# Patient Record
Sex: Female | Born: 1960 | Race: White | Hispanic: No | Marital: Married | Smoking: Current every day smoker
Health system: Southern US, Community
[De-identification: ages and names within clinical notes are randomized; demographics above are authoritative.]

## PROBLEM LIST (undated history)

## (undated) DIAGNOSIS — I10 Essential (primary) hypertension: Secondary | ICD-10-CM

## (undated) HISTORY — PX: ABDOMINAL HYSTERECTOMY: SHX81

---

## 2005-01-26 ENCOUNTER — Ambulatory Visit: Payer: Self-pay | Admitting: Obstetrics and Gynecology

## 2006-03-01 ENCOUNTER — Ambulatory Visit: Payer: Self-pay | Admitting: Obstetrics and Gynecology

## 2007-03-07 ENCOUNTER — Ambulatory Visit: Payer: Self-pay | Admitting: Obstetrics and Gynecology

## 2008-03-14 ENCOUNTER — Ambulatory Visit: Payer: Self-pay | Admitting: Obstetrics and Gynecology

## 2009-02-09 ENCOUNTER — Ambulatory Visit: Payer: Self-pay | Admitting: Gastroenterology

## 2009-03-16 ENCOUNTER — Ambulatory Visit: Payer: Self-pay | Admitting: Obstetrics and Gynecology

## 2010-03-17 ENCOUNTER — Ambulatory Visit: Payer: Self-pay | Admitting: Obstetrics and Gynecology

## 2011-03-21 ENCOUNTER — Ambulatory Visit: Payer: Self-pay | Admitting: Obstetrics and Gynecology

## 2012-03-21 ENCOUNTER — Ambulatory Visit: Payer: Self-pay | Admitting: Obstetrics and Gynecology

## 2013-03-15 ENCOUNTER — Ambulatory Visit: Payer: Self-pay | Admitting: Gastroenterology

## 2013-03-26 ENCOUNTER — Ambulatory Visit: Payer: Self-pay | Admitting: Obstetrics and Gynecology

## 2014-03-05 ENCOUNTER — Ambulatory Visit: Payer: Self-pay | Admitting: Podiatry

## 2014-03-27 ENCOUNTER — Ambulatory Visit: Payer: Self-pay | Admitting: Obstetrics and Gynecology

## 2014-04-24 ENCOUNTER — Ambulatory Visit: Payer: Self-pay | Admitting: Podiatry

## 2014-07-15 IMAGING — MG MM DIGITAL SCREENING BILAT W/ CAD
1 series · 4 of 4 positions shown · non-contrast
Comparison: Previous exam(s).

CLINICAL DATA: Screening.

EXAM:
DIGITAL SCREENING BILATERAL MAMMOGRAM WITH CAD

[R CC · right · 4 of 4 slices shown]
[im 1/4]
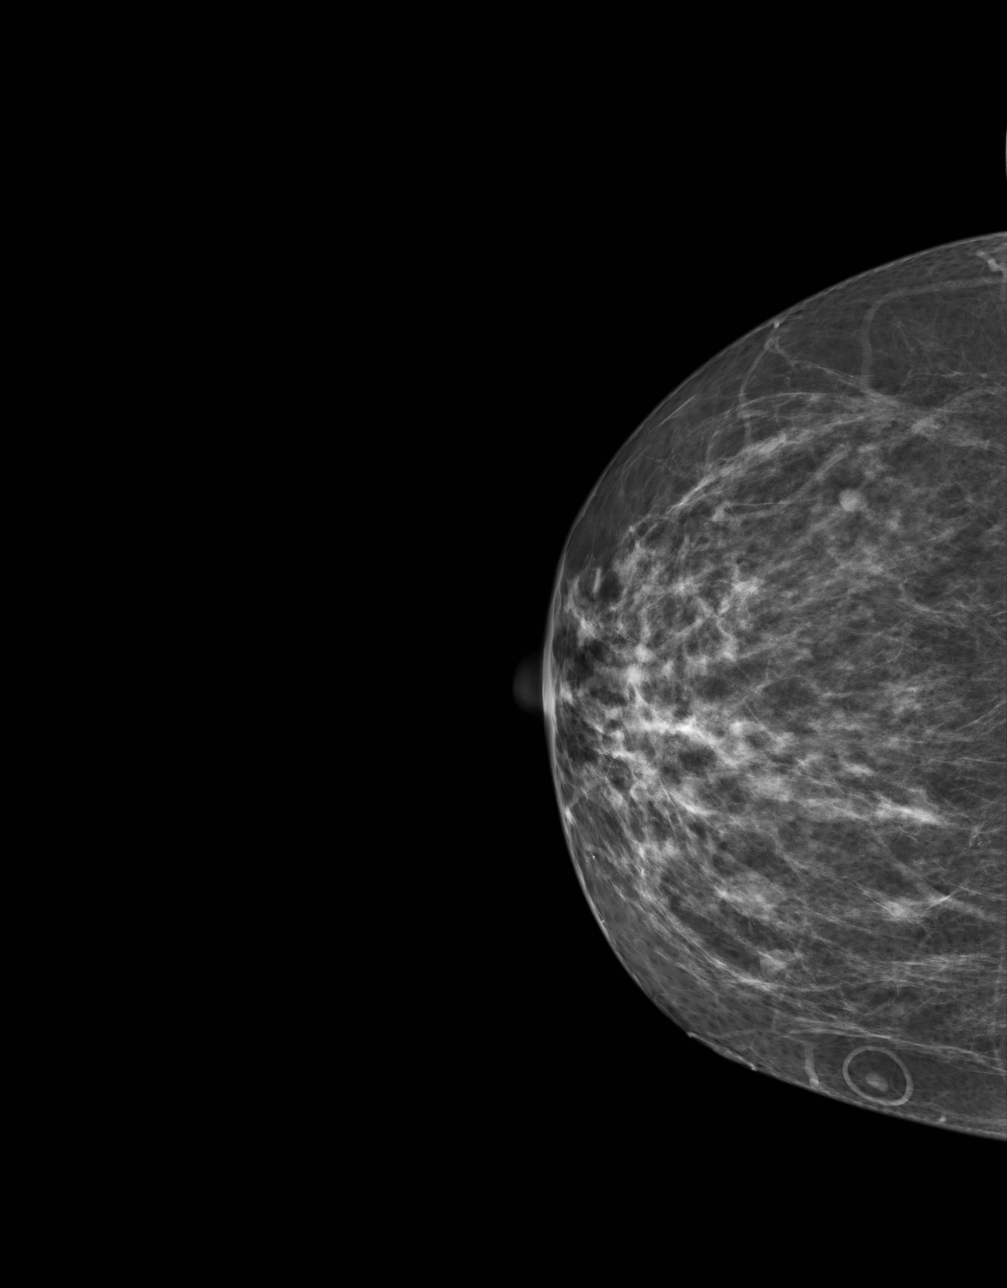
[im 2/4]
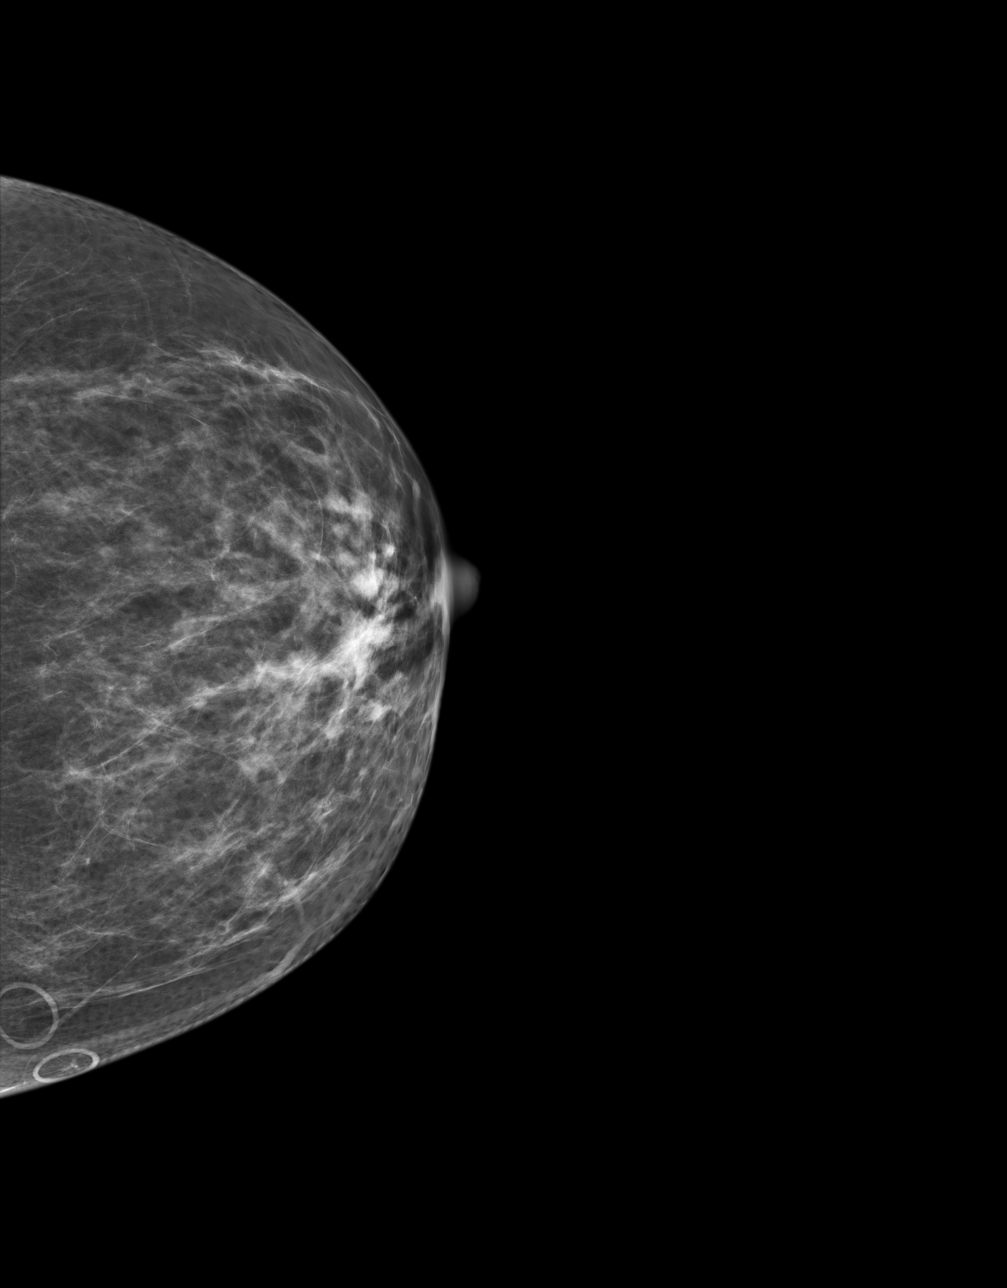
[im 3/4]
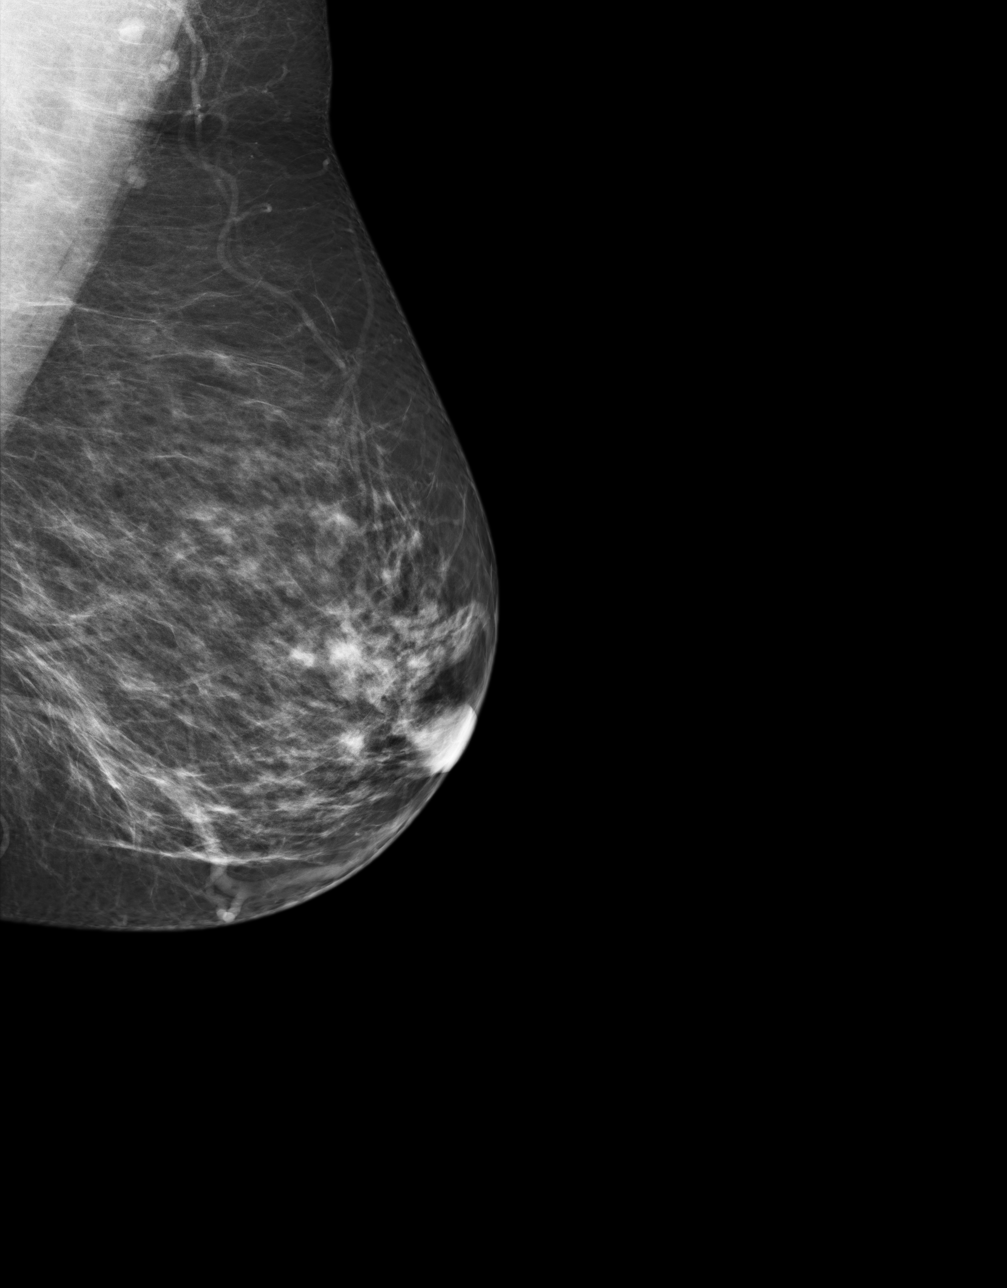
[im 4/4]
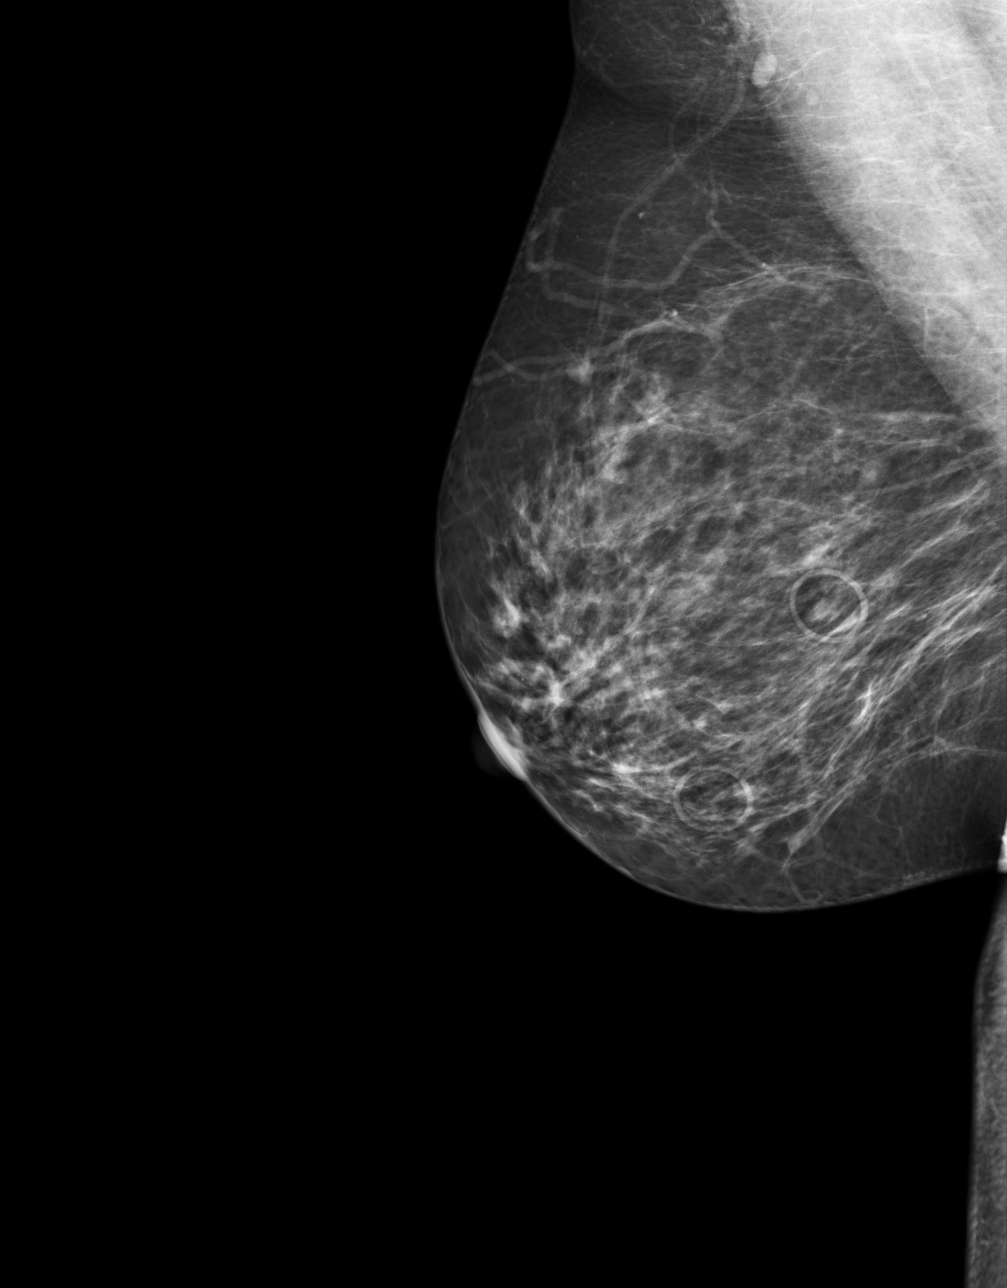

[4 of 4 positions shown; findings below may reference images not displayed]

ACR Breast Density Category b: There are scattered areas of
fibroglandular density.
FINDINGS: There are no findings suspicious for malignancy. Images were
processed with CAD.
IMPRESSION: No mammographic evidence of malignancy. A result letter of this
screening mammogram will be mailed directly to the patient.

RECOMMENDATION:
Screening mammogram in one year. (Code:GW-[id number])

BI-RADS CATEGORY  1: Negative

## 2014-10-21 ENCOUNTER — Ambulatory Visit: Payer: Self-pay

## 2015-01-06 ENCOUNTER — Other Ambulatory Visit: Payer: Self-pay | Admitting: Obstetrics and Gynecology

## 2015-01-06 DIAGNOSIS — Z1231 Encounter for screening mammogram for malignant neoplasm of breast: Secondary | ICD-10-CM

## 2015-03-21 ENCOUNTER — Emergency Department
Admission: EM | Admit: 2015-03-21 | Discharge: 2015-03-21 | Disposition: A | Discharge status: Home / Self Care | Payer: BC Managed Care – PPO | Attending: Emergency Medicine | Admitting: Emergency Medicine

## 2015-03-21 ENCOUNTER — Encounter: Payer: Self-pay | Admitting: Emergency Medicine

## 2015-03-21 ENCOUNTER — Emergency Department: Payer: BC Managed Care – PPO

## 2015-03-21 DIAGNOSIS — W010XXA Fall on same level from slipping, tripping and stumbling without subsequent striking against object, initial encounter: Secondary | ICD-10-CM | POA: Insufficient documentation

## 2015-03-21 DIAGNOSIS — Y998 Other external cause status: Secondary | ICD-10-CM | POA: Insufficient documentation

## 2015-03-21 DIAGNOSIS — Y92014 Private driveway to single-family (private) house as the place of occurrence of the external cause: Secondary | ICD-10-CM | POA: Insufficient documentation

## 2015-03-21 DIAGNOSIS — Z720 Tobacco use: Secondary | ICD-10-CM | POA: Insufficient documentation

## 2015-03-21 DIAGNOSIS — Y9389 Activity, other specified: Secondary | ICD-10-CM | POA: Diagnosis not present

## 2015-03-21 DIAGNOSIS — S8002XA Contusion of left knee, initial encounter: Secondary | ICD-10-CM

## 2015-03-21 DIAGNOSIS — I10 Essential (primary) hypertension: Secondary | ICD-10-CM | POA: Diagnosis not present

## 2015-03-21 DIAGNOSIS — S8992XA Unspecified injury of left lower leg, initial encounter: Secondary | ICD-10-CM | POA: Diagnosis present

## 2015-03-21 HISTORY — DX: Essential (primary) hypertension: I10

## 2015-03-21 MED ORDER — OXYCODONE-ACETAMINOPHEN 5-325 MG PO TABS: 1.0000 | ORAL_TABLET | Freq: Four times a day (QID) | ORAL | Status: AC | PRN

## 2015-03-21 MED ORDER — KETOROLAC TROMETHAMINE 60 MG/2ML IM SOLN
60.0000 mg | Freq: Once | INTRAMUSCULAR | Status: AC
  Administered 2015-03-21: 60 mg via INTRAMUSCULAR
  Filled 2015-03-21: qty 2

## 2015-03-21 MED ORDER — IBUPROFEN 800 MG PO TABS: 800.0000 mg | ORAL_TABLET | Freq: Three times a day (TID) | ORAL | Status: DC | PRN

## 2015-03-21 MED ORDER — HYDROMORPHONE HCL 1 MG/ML IJ SOLN
1.0000 mg | Freq: Once | INTRAMUSCULAR | Status: AC
  Administered 2015-03-21: 1 mg via INTRAMUSCULAR
  Filled 2015-03-21: qty 1

## 2015-03-21 NOTE — ED Provider Notes (Signed)
[facility] Emergency Department Provider Note  ____________________________________________  Time seen: Approximately 6:47 PM  I have reviewed the triage vital signs and the nursing notes.   HISTORY  Chief Complaint Knee Pain    HPI Tamara Maxwell is a 54 y.o. female complaining of left knee pain secondary to a fall. Patient states she's tripped and fell off is of her driveway. Patient states she landed on her left knee since the incident unable to weight-bear. Patient rating the pain as a 10 over 10. No palliative measures taken for this complaint.Patient state there is some internal derangements of her knee as she is pending MRI by her treating orthopedic Dr.   Past Medical History  Diagnosis Date  . Hypertension     There are no active problems to display for this patient.   Past Surgical History  Procedure Laterality Date  . Abdominal hysterectomy      No current outpatient prescriptions on file.  Allergies Review of patient's allergies indicates no known allergies.  No family history on file.  Social History Social History  Substance Use Topics  . Smoking status: Current Every Day Smoker    Types: Cigarettes  . Smokeless tobacco: None  . Alcohol Use: No    Review of Systems Constitutional: No fever/chills Eyes: No visual changes. ENT: No sore throat. Cardiovascular: Denies chest pain. Respiratory: Denies shortness of breath. Gastrointestinal: No abdominal pain.  No nausea, no vomiting.  No diarrhea.  No constipation. Genitourinary: Negative for dysuria. Musculoskeletal: Left knee pain. Skin: Negative for rash. Neurological: Negative for headaches, focal weakness or numbness. 10-point ROS otherwise negative.  ____________________________________________   PHYSICAL EXAM:  VITAL SIGNS: ED Triage Vitals  Enc Vitals Group     BP 03/21/15 1744 123/74 mmHg     Pulse Rate 03/21/15 1744 83     Resp 03/21/15 1744 20      Temp 03/21/15 1744 97.6 F (36.4 C)     Temp Source 03/21/15 1744 Oral     SpO2 03/21/15 1744 98 %     Weight 03/21/15 1744 220 lb (99.791 kg)     Height 03/21/15 1744  (1.676 m)     Head Cir --      Peak Flow --      Pain Score 03/21/15 1744 10     Pain Loc --      Pain Edu? --      Excl. in GC? --     Constitutional: Alert and oriented. Well appearing and in no acute distress. Eyes: Conjunctivae are normal. PERRL. EOMI. Head: Atraumatic. Nose: No congestion/rhinnorhea. Mouth/Throat: Mucous membranes are moist.  Oropharynx non-erythematous. Neck: No stridor. No cervical spine tenderness to palpation. Hematological/Lymphatic/Immunilogical: No cervical lymphadenopathy. Cardiovascular: Normal rate, regular rhythm. Grossly normal heart sounds.  Good peripheral circulation. Respiratory: Normal respiratory effort.  No retractions. Lungs CTAB. Gastrointestinal: Soft and nontender. No distention. No abdominal bruits. No CVA tenderness. Musculoskeletal: No deformity of the left knee. No obvious edema. There is no abrasion anterior patella. There is some crepitus with palpation. Neurologic:  Normal speech and language. No gross focal neurologic deficits are appreciated. No gait instability. Skin:  Skin is warm, dry and intact. No rash noted. Psychiatric: Mood and affect are normal. Speech and behavior are normal.  ____________________________________________   LABS (all labs ordered are listed, but only abnormal results are displayed)  Labs Reviewed - No data to display ____________________________________________  EKG   ____________________________________________  RADIOLOGY  No acute findings on  x-ray. I, Joni Reiningonald K Richelle Glick, personally viewed and evaluated these images (plain radiographs) as part of my medical decision making.   ____________________________________________   PROCEDURES  Procedure(s) performed: None  Critical Care performed:  No  ____________________________________________   INITIAL IMPRESSION / ASSESSMENT AND PLAN / ED COURSE  Pertinent labs & imaging results that were available during my care of the patient were reviewed by me and considered in my medical decision making (see chart for details).  Left knee contusion secondary to fall. Thus negative x-ray findings with patient. Patient given crutches for nonweightbearing and advised follow-up with treating orthopedic Dr. Patient get a prescription for Percocet and ibuprofen. ____________________________________________   FINAL CLINICAL IMPRESSION(S) / ED DIAGNOSES  Final diagnoses:  Knee contusion, left, initial encounter      Joni Reiningonald K Laylana Gerwig, PA-C 03/21/15 1943  Joni Reiningonald K Min Collymore, PA-C 03/21/15 1944  Arnaldo NatalPaul F Malinda, MD 03/21/15 1950

## 2015-03-21 NOTE — Discharge Instructions (Signed)
Advised to ambulate with crutches for 3-5 days. Advised follow-up effusion orthopedic Dr.

## 2015-03-21 NOTE — ED Notes (Signed)
Pt reports tripping off end of driveway and falling, denies LOC, denies hitting head, denies neck pain. Pt reports pain to left knee and pt is unable to walk.

## 2015-03-21 NOTE — ED Notes (Signed)
Attempted to immobilize knee and provide crutches.  Pt insists on waiting for pain med first.

## 2015-04-01 ENCOUNTER — Ambulatory Visit: Payer: Self-pay

## 2015-06-17 ENCOUNTER — Ambulatory Visit
Admission: RE | Admit: 2015-06-17 | Discharge: 2015-06-17 | Disposition: A | Discharge status: Home / Self Care | Payer: BC Managed Care – PPO | Source: Ambulatory Visit | Attending: Obstetrics and Gynecology | Admitting: Obstetrics and Gynecology

## 2015-06-17 DIAGNOSIS — Z1231 Encounter for screening mammogram for malignant neoplasm of breast: Secondary | ICD-10-CM | POA: Insufficient documentation

## 2016-11-14 ENCOUNTER — Other Ambulatory Visit: Payer: Self-pay | Admitting: Obstetrics and Gynecology

## 2016-11-14 DIAGNOSIS — Z1231 Encounter for screening mammogram for malignant neoplasm of breast: Secondary | ICD-10-CM

## 2016-11-28 ENCOUNTER — Ambulatory Visit
Admission: RE | Admit: 2016-11-28 | Discharge: 2016-11-28 | Disposition: A | Discharge status: Home / Self Care | Payer: BC Managed Care – PPO | Source: Ambulatory Visit | Attending: Obstetrics and Gynecology | Admitting: Obstetrics and Gynecology

## 2016-11-28 DIAGNOSIS — Z1231 Encounter for screening mammogram for malignant neoplasm of breast: Secondary | ICD-10-CM | POA: Diagnosis not present

## 2018-02-09 ENCOUNTER — Other Ambulatory Visit: Payer: Self-pay | Admitting: Obstetrics and Gynecology

## 2018-02-09 DIAGNOSIS — Z1231 Encounter for screening mammogram for malignant neoplasm of breast: Secondary | ICD-10-CM

## 2018-02-20 ENCOUNTER — Ambulatory Visit
Admission: RE | Admit: 2018-02-20 | Discharge: 2018-02-20 | Disposition: A | Discharge status: Home / Self Care | Payer: BC Managed Care – PPO | Source: Ambulatory Visit | Attending: Obstetrics and Gynecology | Admitting: Obstetrics and Gynecology

## 2018-02-20 DIAGNOSIS — Z1231 Encounter for screening mammogram for malignant neoplasm of breast: Secondary | ICD-10-CM | POA: Diagnosis not present

## 2019-01-14 ENCOUNTER — Other Ambulatory Visit: Payer: Self-pay | Admitting: Obstetrics and Gynecology

## 2019-01-14 DIAGNOSIS — Z1231 Encounter for screening mammogram for malignant neoplasm of breast: Secondary | ICD-10-CM

## 2019-02-22 ENCOUNTER — Ambulatory Visit
Admission: RE | Admit: 2019-02-22 | Discharge: 2019-02-22 | Disposition: A | Discharge status: Home / Self Care | Payer: BC Managed Care – PPO | Source: Ambulatory Visit | Attending: Obstetrics and Gynecology | Admitting: Obstetrics and Gynecology

## 2019-02-22 DIAGNOSIS — Z1231 Encounter for screening mammogram for malignant neoplasm of breast: Secondary | ICD-10-CM | POA: Insufficient documentation

## 2020-01-06 ENCOUNTER — Other Ambulatory Visit: Payer: Self-pay | Admitting: Obstetrics and Gynecology

## 2020-01-06 DIAGNOSIS — Z1231 Encounter for screening mammogram for malignant neoplasm of breast: Secondary | ICD-10-CM

## 2020-02-24 ENCOUNTER — Ambulatory Visit
Admission: RE | Admit: 2020-02-24 | Discharge: 2020-02-24 | Disposition: A | Discharge status: Home / Self Care | Payer: BC Managed Care – PPO | Source: Ambulatory Visit | Attending: Obstetrics and Gynecology | Admitting: Obstetrics and Gynecology

## 2020-02-24 ENCOUNTER — Other Ambulatory Visit: Payer: Self-pay

## 2020-02-24 DIAGNOSIS — Z1231 Encounter for screening mammogram for malignant neoplasm of breast: Secondary | ICD-10-CM | POA: Diagnosis not present

## 2021-01-07 ENCOUNTER — Other Ambulatory Visit: Payer: Self-pay | Admitting: Obstetrics and Gynecology

## 2021-01-07 ENCOUNTER — Other Ambulatory Visit: Payer: Self-pay | Admitting: Internal Medicine

## 2021-01-12 ENCOUNTER — Other Ambulatory Visit: Payer: Self-pay | Admitting: Obstetrics and Gynecology

## 2021-01-12 DIAGNOSIS — Z1231 Encounter for screening mammogram for malignant neoplasm of breast: Secondary | ICD-10-CM

## 2021-03-03 ENCOUNTER — Ambulatory Visit
Admission: RE | Admit: 2021-03-03 | Discharge: 2021-03-03 | Disposition: A | Discharge status: Home / Self Care | Payer: BC Managed Care – PPO | Source: Ambulatory Visit | Attending: Obstetrics and Gynecology | Admitting: Obstetrics and Gynecology

## 2021-03-03 ENCOUNTER — Other Ambulatory Visit: Payer: Self-pay

## 2021-03-03 DIAGNOSIS — Z1231 Encounter for screening mammogram for malignant neoplasm of breast: Secondary | ICD-10-CM | POA: Diagnosis not present

## 2022-04-13 ENCOUNTER — Other Ambulatory Visit: Payer: Self-pay | Admitting: Obstetrics and Gynecology

## 2022-04-13 DIAGNOSIS — Z1231 Encounter for screening mammogram for malignant neoplasm of breast: Secondary | ICD-10-CM

## 2022-06-10 ENCOUNTER — Ambulatory Visit
Admission: RE | Admit: 2022-06-10 | Discharge: 2022-06-10 | Disposition: A | Discharge status: Home / Self Care | Payer: BC Managed Care – PPO | Source: Ambulatory Visit | Attending: Obstetrics and Gynecology | Admitting: Obstetrics and Gynecology

## 2022-06-10 DIAGNOSIS — Z1231 Encounter for screening mammogram for malignant neoplasm of breast: Secondary | ICD-10-CM | POA: Diagnosis present

## 2022-06-22 IMAGING — MG MM DIGITAL SCREENING BILAT W/ TOMO AND CAD
8 series · 8 of 24 positions shown · non-contrast
Comparison: Previous exam(s).

CLINICAL DATA: Screening.

EXAM:
DIGITAL SCREENING BILATERAL MAMMOGRAM WITH TOMOSYNTHESIS AND CAD
TECHNIQUE: Bilateral screening digital craniocaudal and mediolateral oblique
mammograms were obtained. Bilateral screening digital breast
tomosynthesis was performed. The images were evaluated with
computer-aided detection.

[R MLO synth-2D]
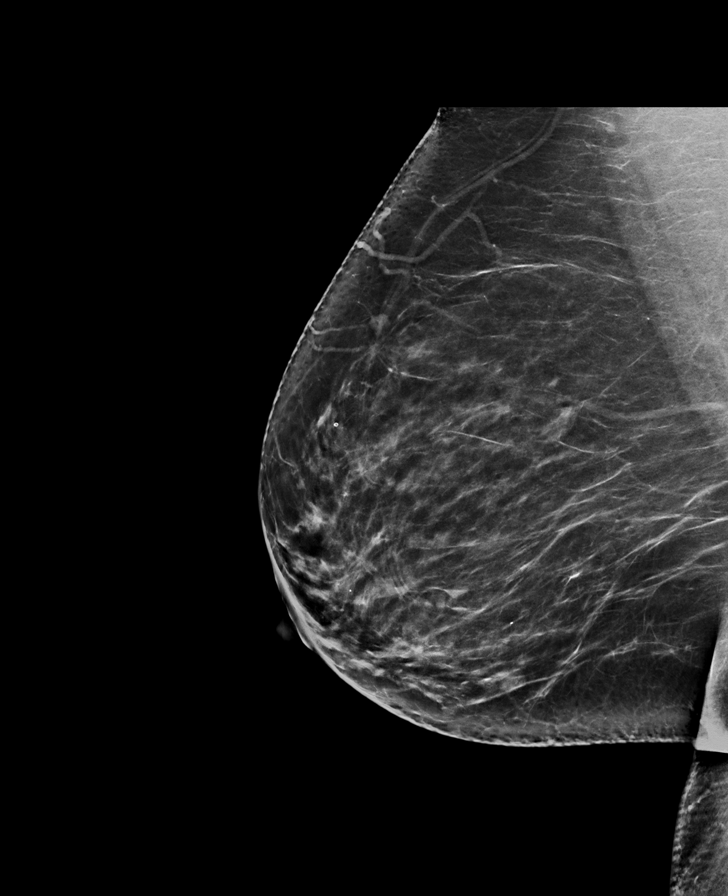

[R CC synth-2D]
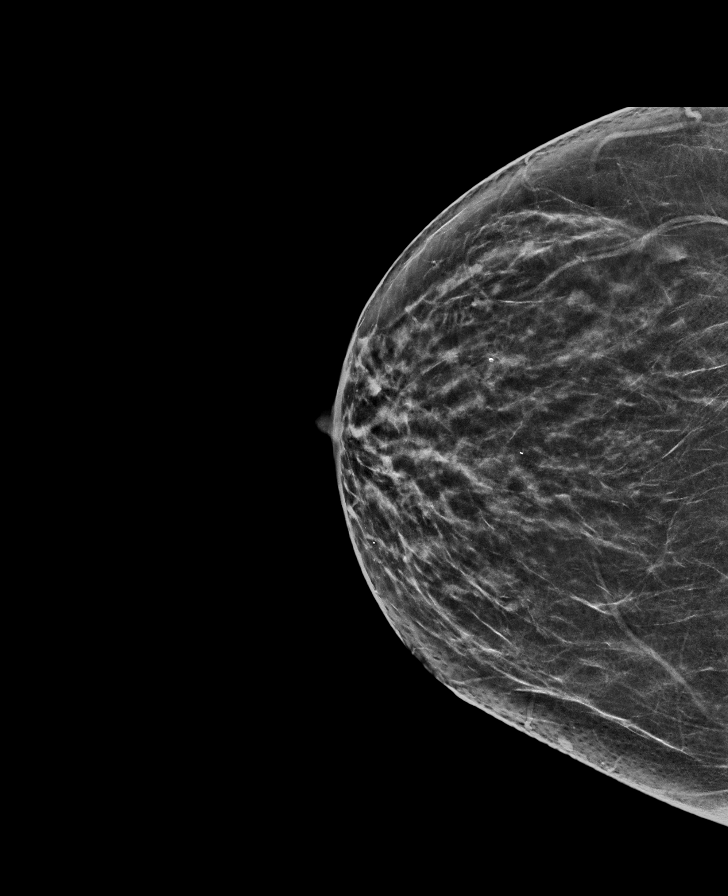

[L MLO synth-2D]
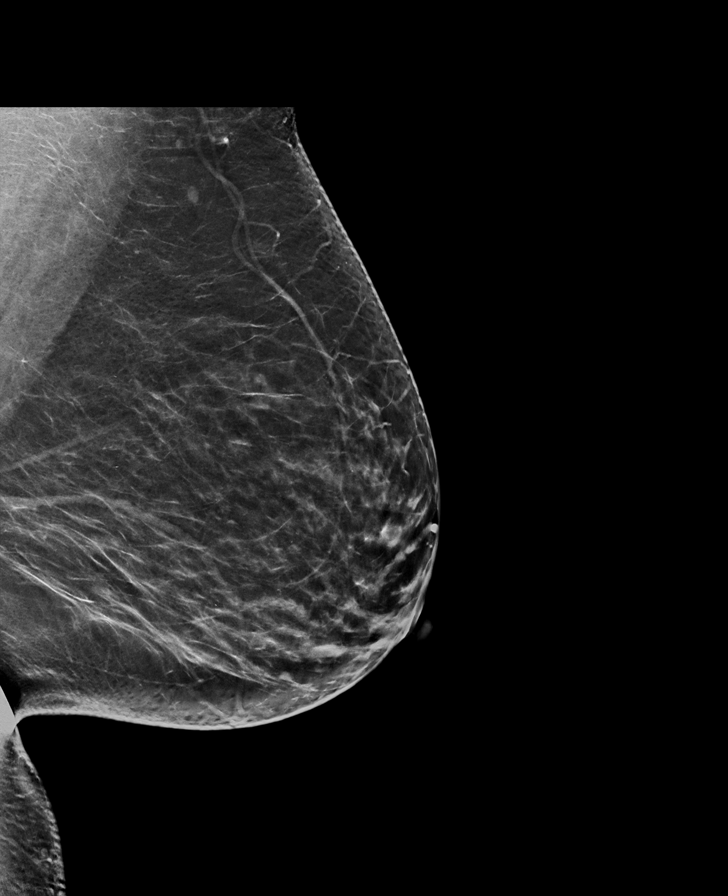

[L CC synth-2D]
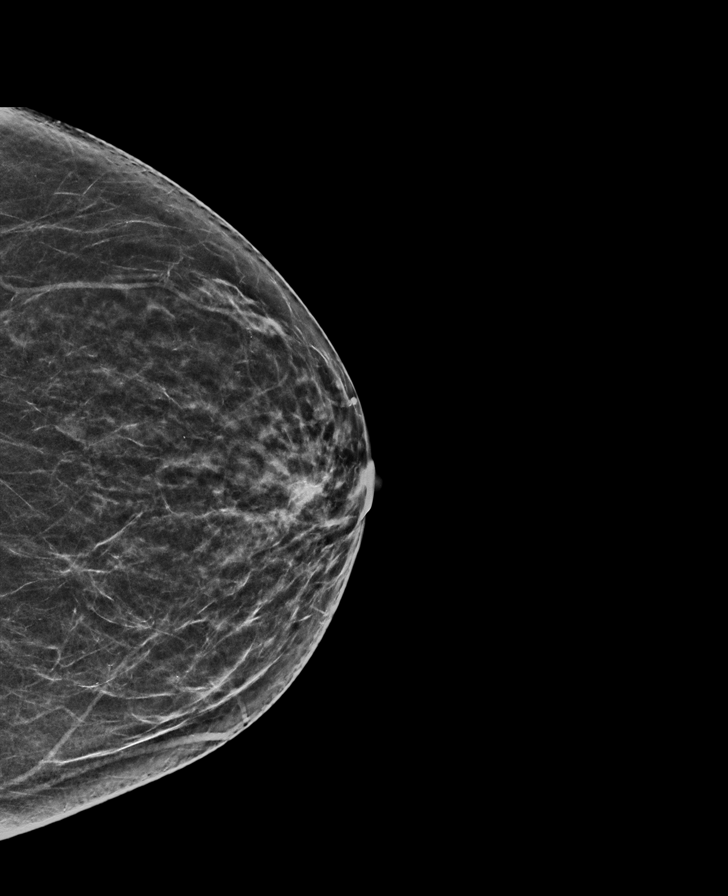

[L CC tomo · tomo slice 33/64.0]
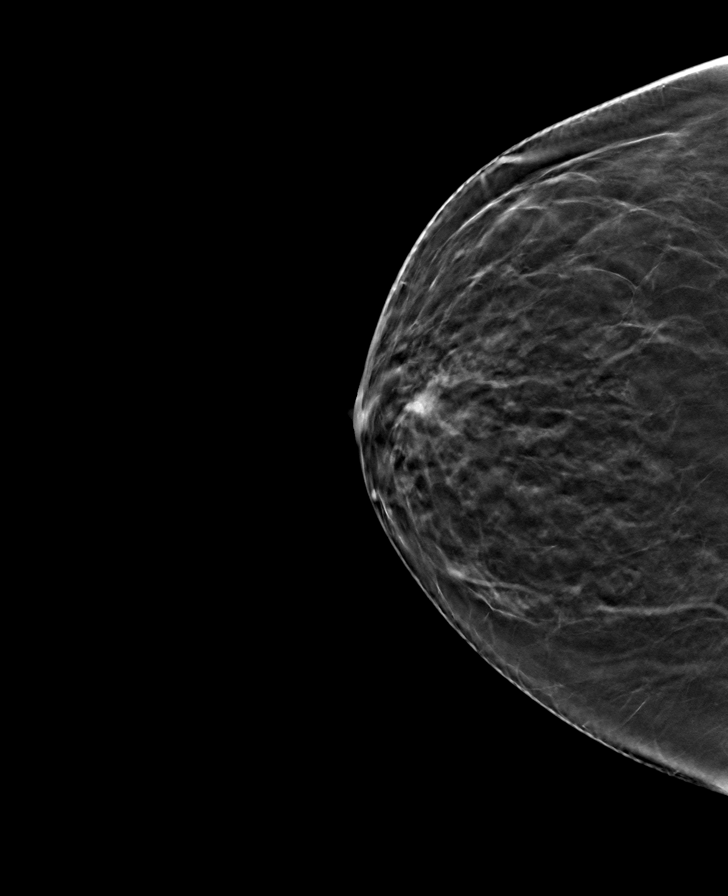

[R MLO tomo · tomo slice 41/80.0]
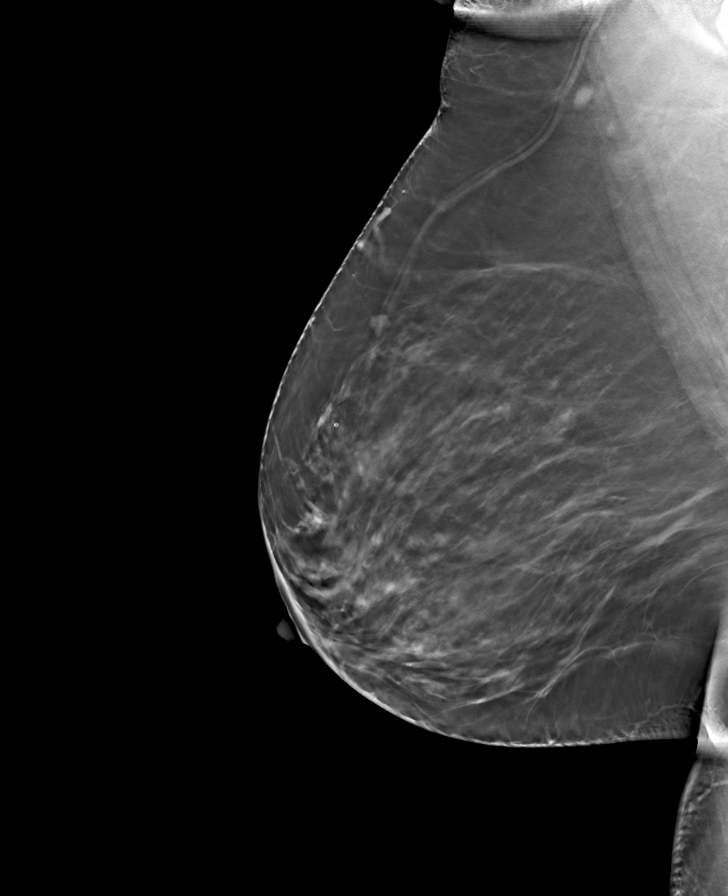

[L MLO tomo · tomo slice 39/78.0]
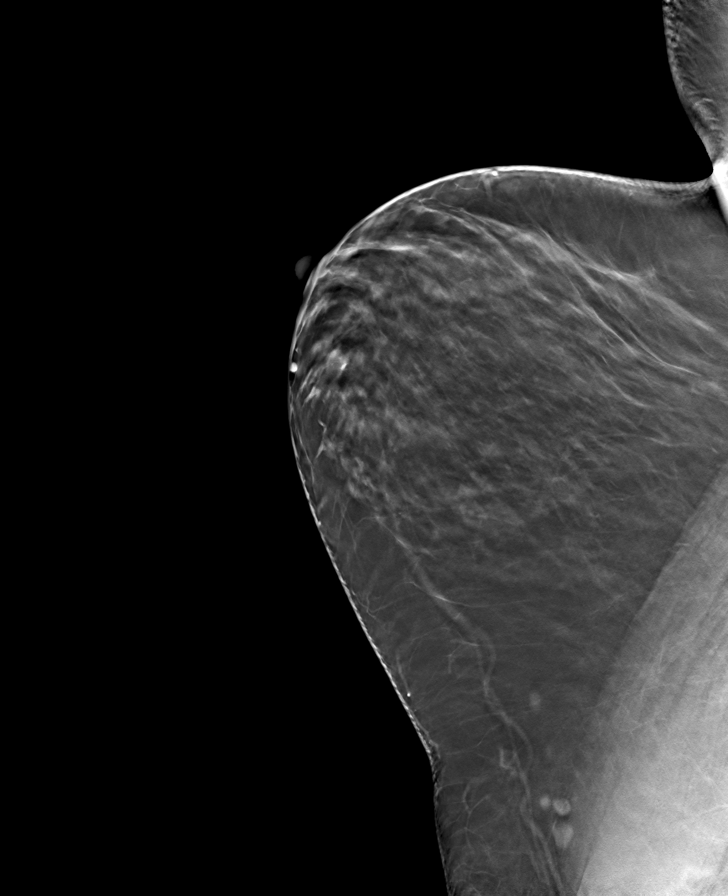

[R CC tomo · tomo slice 34/67.0]
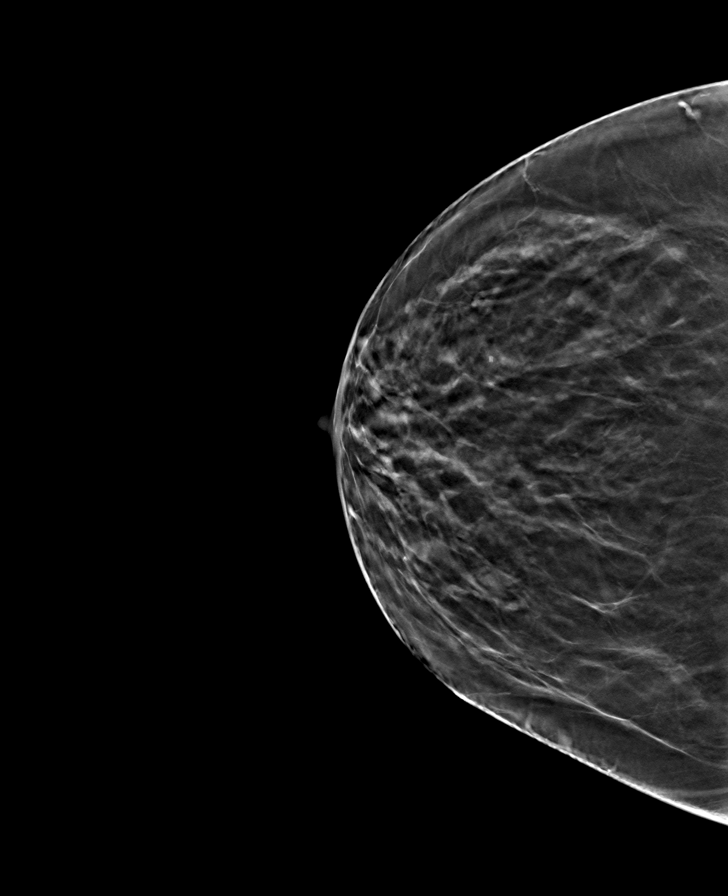

[8 of 24 positions shown; findings below may reference images not displayed]

ACR Breast Density Category b: There are scattered areas of
fibroglandular density.
FINDINGS: There are no findings suspicious for malignancy.
IMPRESSION: No mammographic evidence of malignancy. A result letter of this
screening mammogram will be mailed directly to the patient.

RECOMMENDATION:
Screening mammogram in one year. (Code:[id number]-LD2)

BI-RADS CATEGORY  1: Negative.

## 2023-02-22 ENCOUNTER — Other Ambulatory Visit: Payer: Self-pay | Admitting: Orthopedic Surgery

## 2023-02-22 DIAGNOSIS — M2392 Unspecified internal derangement of left knee: Secondary | ICD-10-CM

## 2023-03-02 ENCOUNTER — Encounter: Payer: Self-pay | Admitting: Orthopedic Surgery

## 2023-03-11 ENCOUNTER — Ambulatory Visit
Admission: RE | Admit: 2023-03-11 | Discharge: 2023-03-11 | Disposition: A | Discharge status: Home / Self Care | Payer: BC Managed Care – PPO | Source: Ambulatory Visit | Attending: Orthopedic Surgery | Admitting: Orthopedic Surgery

## 2023-03-11 DIAGNOSIS — M2392 Unspecified internal derangement of left knee: Secondary | ICD-10-CM

## 2023-05-19 ENCOUNTER — Other Ambulatory Visit: Payer: Self-pay | Admitting: Obstetrics and Gynecology

## 2023-05-19 DIAGNOSIS — Z1231 Encounter for screening mammogram for malignant neoplasm of breast: Secondary | ICD-10-CM

## 2023-05-23 ENCOUNTER — Other Ambulatory Visit: Payer: Self-pay

## 2023-05-23 ENCOUNTER — Encounter: Payer: Self-pay | Admitting: Oncology

## 2023-05-23 ENCOUNTER — Ambulatory Visit: Payer: Self-pay | Admitting: Oncology

## 2023-05-23 VITALS — BP 140/88 | HR 100 | Temp 98.8°F | Ht 66.0 in | Wt 210.0 lb

## 2023-05-23 DIAGNOSIS — W19XXXA Unspecified fall, initial encounter: Secondary | ICD-10-CM

## 2023-05-23 NOTE — Patient Instructions (Signed)
 Tamara Maxwell,  If your symptoms worsen tomorrow or on Thursday, I recommend you have imaging which I have already placed orders for.  You can head over to the outpatient imaging center off of [address].  The address is [address] and you do not need an appointment.  It is first come first serve and they are fairly quick.  As we discussed in clinic, I recommend you take 800 mg ibuprofen /Motrin  or Advil  every 8 hours while awake, compress with either Ace bandage or brace to right ankle left knee and left wrist.  Intermittently use ice and heat as needed for comfort.  Elevate affected areas as you can.  Keep me updated and let me know if you need anything.  Delon Hope, NP 05/23/2023 3:17 PM

## 2023-05-23 NOTE — Progress Notes (Signed)
 Tamara Maxwell Maxwell [address] [place], [place] [address] Phone: [phone] Fax: [phone]   Office Visit Note  Patient Name: Tamara Maxwell Maxwell  Date of Apmuy:[id number]  Med Rec number [id number]  Date of Maxwell: 05/23/2023  Patient has no known allergies.  Chief Complaint  Patient presents with   Fall    Patient slipped on ice at the end of her driveway this morning and fell while moving her garbage can. She is having back pain, pain  in her L wrist, and R ankle. She is feeling increased soreness as the day progresses. She has taken any OTC meds for pain. No visual bruising noted.    HPI Patient is an 63 y.o. patient here for complaints of a fall that happened at 12:15 pm. Slipped on ice at the end of her driveway and fell onto her buttocks.  Her trash can unfortunately fell directly on top of her.  Reports she feels like she broke her fall with her right wrist.  Having pain in right ankle, right and left wrist and left knee.  She is able to bear weight and walk but her gait is altered secondary to right ankle discomfort.  She was able to roll over and stand up although her right ankle was initially very painful to walk on.  She has not taken anything over-the-counter because she came straight into work.  She has past medical history significant for left knee injections from a meniscus tear.  Had a cortisone injection back in October.  Has arthritis in both knees and in her back.  Per patient, no obvious bruising or broken bone.  Current Medication:  Outpatient Encounter Medications as of 05/23/2023  Medication Sig   azelastine (ASTELIN) 0.1 % nasal spray Place 1 spray into both nostrils 2 (two) times daily as needed.   Cholecalciferol (VITAMIN D3) 25 MCG (1000 UT) CAPS Take by mouth daily.   Cyanocobalamin 500 MCG LOZG Take 1 tablet by mouth daily.   gabapentin (NEURONTIN) 100 MG capsule Take 1 capsule by mouth at bedtime.   hydrochlorothiazide (HYDRODIURIL) 12.5 MG  tablet Take 1 tablet by mouth daily.   ibuprofen  (ADVIL ,MOTRIN ) 800 MG tablet Take 1 tablet (800 mg total) by mouth every 8 (eight) hours as needed for moderate pain.   metFORMIN (GLUCOPHAGE) 500 MG tablet Take 1 tablet by mouth daily with breakfast.   pantoprazole (PROTONIX) 40 MG tablet Take 1 tablet by mouth 2 (two) times daily as needed.   solifenacin (VESICARE) 5 MG tablet Take 5 mg by mouth daily.   valACYclovir (VALTREX) 1000 MG tablet Take 1,000 mg by mouth 2 (two) times daily as needed.   [DISCONTINUED] amLODipine (NORVASC) 5 MG tablet Take 5 mg by mouth daily.   No facility-administered encounter medications on file as of 05/23/2023.   Medical History: Past Medical History:  Diagnosis Date   Hypertension    Vital Signs: BP (!) 140/88   Pulse 100   Temp 98.8 F (37.1 C)   Ht 5' 6 (1.676 m)   Wt 210 lb (95.3 kg)   BMI 33.89 kg/m   ROS: As per HPI.  All other pertinent ROS negative.     Review of Systems  Musculoskeletal:  Positive for arthralgias and gait problem.       Fall today at 12:15 pm on ice    Physical Exam Constitutional:      Appearance: Normal appearance.  Musculoskeletal:     Right wrist: Tenderness present.  Left wrist: Tenderness present. Decreased range of motion.     Cervical back: Normal.     Thoracic back: Normal.     Lumbar back: Normal.     Left knee: Tenderness present over the medial joint line.     Right ankle: Tenderness present over the lateral malleolus.  Neurological:     Mental Status: She is alert.    No results found for this or any previous visit (from the past 24 hours).  Assessment/Plan: 1. Fall, initial encounter (Primary) -Discussed no obvious deformity on exam today but have some concerns regarding right ankle and left wrist.  We discussed rest, ice compression and elevation for the next few days along with 800 mg ibuprofen /Motrin  or Advil  fairly consistently to reduce inflammation.  Recommend compression with either  Ace bandage or knee, wrist or ankle brace that you can find at any local pharmacy.  If symptoms significantly worse tomorrow we discussed imaging of right ankle and left wrist.  Orders are in and all she needs to do is go over to the outpatient imaging center.  General Counseling: Tamara Maxwell verbalizes understanding of the findings of todays visit and agrees with plan of treatment. I have discussed any further diagnostic evaluation that may be needed or ordered today. We also reviewed her medications today. she has been encouraged to call the office with any questions or concerns that should arise related to todays visit.   No orders of the defined types were placed in this encounter.   No orders of the defined types were placed in this encounter.   I spent 20 minutes dedicated to the care of this patient (face-to-face and non-face-to-face) on the date of the encounter to include what is described in the assessment and plan.   Tamara Hope, NP 05/23/2023 2:42 PM

## 2023-05-24 ENCOUNTER — Encounter: Payer: Self-pay | Admitting: Physician Assistant

## 2023-06-22 ENCOUNTER — Ambulatory Visit
Admission: RE | Admit: 2023-06-22 | Discharge: 2023-06-22 | Disposition: A | Discharge status: Home / Self Care | Payer: BC Managed Care – PPO | Source: Ambulatory Visit | Attending: Obstetrics and Gynecology | Admitting: Obstetrics and Gynecology

## 2023-06-22 DIAGNOSIS — Z1231 Encounter for screening mammogram for malignant neoplasm of breast: Secondary | ICD-10-CM | POA: Insufficient documentation

## 2023-10-05 ENCOUNTER — Ambulatory Visit: Payer: Self-pay | Admitting: Adult Health

## 2023-10-26 ENCOUNTER — Other Ambulatory Visit: Payer: Self-pay | Admitting: Emergency Medicine

## 2023-10-26 DIAGNOSIS — F17210 Nicotine dependence, cigarettes, uncomplicated: Secondary | ICD-10-CM

## 2023-10-26 DIAGNOSIS — J449 Chronic obstructive pulmonary disease, unspecified: Secondary | ICD-10-CM

## 2023-10-26 DIAGNOSIS — R053 Chronic cough: Secondary | ICD-10-CM

## 2023-10-26 DIAGNOSIS — Z122 Encounter for screening for malignant neoplasm of respiratory organs: Secondary | ICD-10-CM

## 2023-11-08 ENCOUNTER — Ambulatory Visit
Admission: RE | Admit: 2023-11-08 | Discharge: 2023-11-08 | Disposition: A | Discharge status: Home / Self Care | Source: Ambulatory Visit | Attending: Emergency Medicine | Admitting: Emergency Medicine

## 2023-11-08 DIAGNOSIS — Z122 Encounter for screening for malignant neoplasm of respiratory organs: Secondary | ICD-10-CM | POA: Insufficient documentation

## 2023-11-08 DIAGNOSIS — R053 Chronic cough: Secondary | ICD-10-CM | POA: Diagnosis present

## 2023-11-08 DIAGNOSIS — F17210 Nicotine dependence, cigarettes, uncomplicated: Secondary | ICD-10-CM | POA: Insufficient documentation

## 2023-11-08 DIAGNOSIS — J449 Chronic obstructive pulmonary disease, unspecified: Secondary | ICD-10-CM | POA: Diagnosis present

## 2023-11-23 ENCOUNTER — Other Ambulatory Visit: Payer: Self-pay | Admitting: Emergency Medicine

## 2023-11-23 DIAGNOSIS — R911 Solitary pulmonary nodule: Secondary | ICD-10-CM

## 2023-11-27 ENCOUNTER — Ambulatory Visit
Admission: RE | Admit: 2023-11-27 | Discharge: 2023-11-27 | Disposition: A | Discharge status: Home / Self Care | Source: Ambulatory Visit | Attending: Emergency Medicine | Admitting: Emergency Medicine

## 2023-11-27 DIAGNOSIS — K5730 Diverticulosis of large intestine without perforation or abscess without bleeding: Secondary | ICD-10-CM | POA: Diagnosis not present

## 2023-11-27 DIAGNOSIS — K8020 Calculus of gallbladder without cholecystitis without obstruction: Secondary | ICD-10-CM | POA: Diagnosis not present

## 2023-11-27 DIAGNOSIS — I2510 Atherosclerotic heart disease of native coronary artery without angina pectoris: Secondary | ICD-10-CM | POA: Insufficient documentation

## 2023-11-27 DIAGNOSIS — E119 Type 2 diabetes mellitus without complications: Secondary | ICD-10-CM | POA: Diagnosis not present

## 2023-11-27 DIAGNOSIS — R911 Solitary pulmonary nodule: Secondary | ICD-10-CM | POA: Insufficient documentation

## 2023-11-27 LAB — GLUCOSE, CAPILLARY: Glucose-Capillary: 101 mg/dL — ABNORMAL HIGH (ref 70–99)

## 2023-11-27 MED ORDER — FLUDEOXYGLUCOSE F - 18 (FDG) INJECTION
11.0600 | Freq: Once | INTRAVENOUS | Status: AC | PRN
Start: 2023-11-27 — End: 2023-11-27
  Administered 2023-11-27: 11.06 via INTRAVENOUS

## 2023-12-13 ENCOUNTER — Ambulatory Visit: Admitting: General Practice

## 2023-12-13 ENCOUNTER — Encounter: Payer: Self-pay | Admitting: Internal Medicine

## 2023-12-13 ENCOUNTER — Ambulatory Visit
Admission: RE | Admit: 2023-12-13 | Discharge: 2023-12-13 | Disposition: A | Discharge status: Home / Self Care | Attending: Internal Medicine | Admitting: Internal Medicine

## 2023-12-13 ENCOUNTER — Encounter
Admission: RE | Disposition: A | Discharge status: Home / Self Care | Payer: Self-pay | Source: Home / Self Care | Attending: Internal Medicine

## 2023-12-13 DIAGNOSIS — K5730 Diverticulosis of large intestine without perforation or abscess without bleeding: Secondary | ICD-10-CM | POA: Diagnosis not present

## 2023-12-13 DIAGNOSIS — D120 Benign neoplasm of cecum: Secondary | ICD-10-CM | POA: Diagnosis not present

## 2023-12-13 DIAGNOSIS — K640 First degree hemorrhoids: Secondary | ICD-10-CM | POA: Insufficient documentation

## 2023-12-13 DIAGNOSIS — F17200 Nicotine dependence, unspecified, uncomplicated: Secondary | ICD-10-CM | POA: Insufficient documentation

## 2023-12-13 DIAGNOSIS — K635 Polyp of colon: Secondary | ICD-10-CM | POA: Diagnosis not present

## 2023-12-13 DIAGNOSIS — Z79899 Other long term (current) drug therapy: Secondary | ICD-10-CM | POA: Diagnosis not present

## 2023-12-13 DIAGNOSIS — I10 Essential (primary) hypertension: Secondary | ICD-10-CM | POA: Diagnosis not present

## 2023-12-13 DIAGNOSIS — Z09 Encounter for follow-up examination after completed treatment for conditions other than malignant neoplasm: Secondary | ICD-10-CM | POA: Diagnosis present

## 2023-12-13 HISTORY — PX: COLONOSCOPY: SHX5424

## 2023-12-13 HISTORY — PX: POLYPECTOMY: SHX149

## 2023-12-13 LAB — GLUCOSE, CAPILLARY: Glucose-Capillary: 106 mg/dL — ABNORMAL HIGH (ref 70–99)

## 2023-12-13 SURGERY — COLONOSCOPY
Anesthesia: General

## 2023-12-13 MED ORDER — PROPOFOL 500 MG/50ML IV EMUL
INTRAVENOUS | Status: DC | PRN
  Administered 2023-12-13: 75 ug/kg/min via INTRAVENOUS

## 2023-12-13 MED ORDER — EPHEDRINE SULFATE-NACL 50-0.9 MG/10ML-% IV SOSY
PREFILLED_SYRINGE | INTRAVENOUS | Status: DC | PRN
  Administered 2023-12-13: 10 mg via INTRAVENOUS

## 2023-12-13 MED ORDER — PROPOFOL 10 MG/ML IV BOLUS
INTRAVENOUS | Status: DC | PRN
Start: 2023-12-13 — End: 2023-12-13
  Administered 2023-12-13: 20 mg via INTRAVENOUS
  Administered 2023-12-13 (×2): 40 mg via INTRAVENOUS

## 2023-12-13 MED ORDER — DEXMEDETOMIDINE HCL IN NACL 80 MCG/20ML IV SOLN
INTRAVENOUS | Status: DC | PRN
  Administered 2023-12-13: 20 ug via INTRAVENOUS

## 2023-12-13 MED ORDER — LIDOCAINE HCL (CARDIAC) PF 100 MG/5ML IV SOSY
PREFILLED_SYRINGE | INTRAVENOUS | Status: DC | PRN
  Administered 2023-12-13: 80 mg via INTRAVENOUS

## 2023-12-13 MED ORDER — SODIUM CHLORIDE 0.9 % IV SOLN: INTRAVENOUS | Status: DC

## 2023-12-13 NOTE — Interval H&P Note (Signed)
 History and Physical Interval Note:  12/13/2023 11:01 AM  Tamara Maxwell  has presented today for surgery, with the diagnosis of Hx of adenomatous colonic polyps (Z86.0101).  The various methods of treatment have been discussed with the patient and family. After consideration of risks, benefits and other options for treatment, the patient has consented to  Procedure(s) with comments: COLONOSCOPY (N/A) - DM as a surgical intervention.  The patient's history has been reviewed, patient examined, no change in status, stable for surgery.  I have reviewed the patient's chart and labs.  Questions were answered to the patient's satisfaction.     [place], Jackson Coffield

## 2023-12-13 NOTE — H&P (Signed)
 Outpatient short stay form Pre-procedure 12/13/2023 10:58 AM Tamara Maxwell, M.D.  Primary Physician: Tamara Maxwell, M.D.  Reason for visit:  Personal history of adenomatous colon polyps  History of present illness:  Tamara Maxwell presents to the [facility] GI clinic after receiving colon form in the mail. She has personal hx of adenomatous colon polyps. Last colonoscopy performed by Dr. Aundria January 2020 removed six subcentimeter polyps from sigmoid colon with path showing tubular adenomas x2 and hyperplastic polyps x4. She denies any acute GI complaints or concerns at this time. She denies any issues with diarrhea, constipation, hematochezia, melena, fecal urgency, or fecal incontinence. She is having 3 bowel movements daily on average depending on what she eats. This has been her normal for quite some time. She denies any nocturnal stooling. There are no complaints of abdominal pain or abdominal cramping preceding bowel movements. Appetite and diet are stable without any unintentional weight loss. She denies any UGI symptoms such as nausea, vomiting, refractory GERD symptoms, esophageal dysphagia, odynophagia, early satiety, hoarseness, or epigastric abdominal pain. She is compliant with Protonix 40 mg daily. As long as she takes it, things work well. If she forgets to take it and consumes chocolate or spicy foods she can get reflux and indigestion symptoms. She had a bad experience at [facility] for her last colonoscopy as she reports she didn't like the lack of privacy. She is requesting to be done at the hospital if possible.     Current Facility-Administered Medications:    0.9 %  sodium chloride  infusion, , Intravenous, Continuous, [name], [name], MD, Last Rate: 20 mL/hr at 12/13/23 0936, New Bag at 12/13/23 0936  Medications Prior to Admission  Medication Sig Dispense Refill Last Dose/Taking   Cholecalciferol (VITAMIN D3) 25 MCG (1000 UT) CAPS Take by mouth daily.   12/12/2023 at  6:00  AM   hydrochlorothiazide (HYDRODIURIL) 12.5 MG tablet Take 1 tablet by mouth daily.   12/12/2023 at  6:00 AM   azelastine (ASTELIN) 0.1 % nasal spray Place 1 spray into both nostrils 2 (two) times daily as needed. (Patient not taking: Reported on 12/13/2023)   Not Taking   Cyanocobalamin 500 MCG LOZG Take 1 tablet by mouth daily. (Patient not taking: Reported on 12/13/2023)   Not Taking   gabapentin (NEURONTIN) 100 MG capsule Take 1 capsule by mouth at bedtime. (Patient not taking: Reported on 12/13/2023)   Not Taking   ibuprofen  (ADVIL ,MOTRIN ) 800 MG tablet Take 1 tablet (800 mg total) by mouth every 8 (eight) hours as needed for moderate pain. (Patient not taking: Reported on 12/13/2023) 15 tablet 0 Not Taking   metFORMIN (GLUCOPHAGE) 500 MG tablet Take 1 tablet by mouth daily with breakfast.   12/11/2023 at  6:00 AM   pantoprazole (PROTONIX) 40 MG tablet Take 1 tablet by mouth 2 (two) times daily as needed. (Patient not taking: Reported on 12/13/2023)   Not Taking   solifenacin (VESICARE) 5 MG tablet Take 5 mg by mouth daily. (Patient not taking: Reported on 12/13/2023)   Not Taking   valACYclovir (VALTREX) 1000 MG tablet Take 1,000 mg by mouth 2 (two) times daily as needed. (Patient not taking: Reported on 12/13/2023)   Not Taking     No Known Allergies   Past Medical History:  Diagnosis Date   Hypertension     Review of systems:  Otherwise negative.    Physical Exam  Gen: Alert, oriented. Appears stated age.  HEENT: [place]/AT. PERRLA. Lungs: CTA, no wheezes. CV: RR  nl S1, S2. Abd: soft, benign, no masses. BS+ Ext: No edema. Pulses 2+    Planned procedures: Proceed with colonoscopy. The patient understands the nature of the planned procedure, indications, risks, alternatives and potential complications including but not limited to bleeding, infection, perforation, damage to internal organs and possible oversedation/side effects from anesthesia. The patient agrees and gives consent to  proceed.  Please refer to procedure notes for findings, recommendations and patient disposition/instructions.     Tamara Maxwell, M.D. Gastroenterology 12/13/2023  10:58 AM

## 2023-12-13 NOTE — Anesthesia Preprocedure Evaluation (Signed)
 Anesthesia Evaluation  Patient identified by MRN, date of birth, ID band Patient awake    Reviewed: Allergy & Precautions, NPO status , Patient's Chart, lab work & pertinent test results  Airway Mallampati: III  TM Distance: >3 FB Neck ROM: full    Dental  (+) Chipped   Pulmonary neg pulmonary ROS, Current Smoker   Pulmonary exam normal        Cardiovascular hypertension, negative cardio ROS Normal cardiovascular exam     Neuro/Psych negative neurological ROS  negative psych ROS   GI/Hepatic negative GI ROS, Neg liver ROS,,,  Endo/Other  negative endocrine ROS    Renal/GU negative Renal ROS  negative genitourinary   Musculoskeletal   Abdominal   Peds  Hematology negative hematology ROS (+)   Anesthesia Other Findings Past Medical History: No date: Hypertension  Past Surgical History: No date: ABDOMINAL HYSTERECTOMY  BMI    Body Mass Index: 32.44 kg/m      Reproductive/Obstetrics negative OB ROS                              Anesthesia Physical Anesthesia Plan  ASA: 2  Anesthesia Plan: General   Post-op Pain Management: Minimal or no pain anticipated   Induction: Intravenous  PONV Risk Score and Plan: 2 and Propofol  infusion and TIVA  Airway Management Planned: Nasal Cannula  Additional Equipment: None  Intra-op Plan:   Post-operative Plan:   Informed Consent: I have reviewed the patients History and Physical, chart, labs and discussed the procedure including the risks, benefits and alternatives for the proposed anesthesia with the patient or authorized representative who has indicated his/her understanding and acceptance.     Dental advisory given  Plan Discussed with: CRNA and Surgeon  Anesthesia Plan Comments: (Discussed risks of anesthesia with patient, including possibility of difficulty with spontaneous ventilation under anesthesia necessitating airway  intervention, PONV, and rare risks such as cardiac or respiratory or neurological events, and allergic reactions. Discussed the role of CRNA in patient's perioperative care. Patient understands.)        Anesthesia Quick Evaluation

## 2023-12-13 NOTE — Transfer of Care (Signed)
 Immediate Anesthesia Transfer of Care Note  Patient: Tamara Maxwell  Procedure(s) Performed: COLONOSCOPY POLYPECTOMY, INTESTINE  Patient Location: PACU  Anesthesia Type:General  Level of Consciousness: sedated  Airway & Oxygen Therapy: Patient Spontanous Breathing  Post-op Assessment: Report given to RN and Post -op Vital signs reviewed and stable  Post vital signs: Reviewed and stable  Last Vitals:  Vitals Value Taken Time  BP 121/60 12/13/23 11:30  Temp    Pulse 81 12/13/23 11:31  Resp 15 12/13/23 11:31  SpO2 99 % 12/13/23 11:31  Vitals shown include unfiled device data.  Last Pain:  Vitals:   12/13/23 0924  PainSc: 0-No pain         Complications: No notable events documented.

## 2023-12-13 NOTE — Op Note (Signed)
 [facility] Gastroenterology Patient Name: Tamara Maxwell Procedure Date: 12/13/2023 11:02 AM MRN: [id number] Account #: [id number] Date of Birth: [birth year] Admit Type: Outpatient Age: 63 Room: [facility] ENDO ROOM 3 Gender: Female Note Status: Finalized Instrument Name: Arvis [id number] Procedure:             Colonoscopy Indications:           High risk colon cancer surveillance: Personal history                         of non-advanced adenoma Providers:             Chaselyn Nanney K. Sigrid Schwebach MD, MD Medicines:             Propofol  per Anesthesia Complications:         No immediate complications. Estimated blood loss:                         Minimal. Procedure:             Pre-Anesthesia Assessment:                        - The risks and benefits of the procedure and the                         sedation options and risks were discussed with the                         patient. All questions were answered and informed                         consent was obtained.                        - Patient identification and proposed procedure were                         verified prior to the procedure by the nurse. The                         procedure was verified in the procedure room.                        - ASA Grade Assessment: III - A patient with severe                         systemic disease.                        - After reviewing the risks and benefits, the patient                         was deemed in satisfactory condition to undergo the                         procedure.                        After obtaining informed consent, the colonoscope was  passed under direct vision. Throughout the procedure,                         the patient's blood pressure, pulse, and oxygen                         saturations were monitored continuously. The                         Colonoscope was introduced through the anus and                         advanced to  the the cecum, identified by appendiceal                         orifice and ileocecal valve. The colonoscopy was                         performed without difficulty. The patient tolerated                         the procedure well. The quality of the bowel                         preparation was good. The ileocecal valve, appendiceal                         orifice, and rectum were photographed. Findings:      The perianal and digital rectal examinations were normal. Pertinent       negatives include normal sphincter tone and no palpable rectal lesions.      Non-bleeding internal hemorrhoids were found during retroflexion. The       hemorrhoids were Grade I (internal hemorrhoids that do not prolapse).      A few small-mouthed diverticula were found in the sigmoid colon.      A 6 mm polyp was found in the cecum. The polyp was sessile. The polyp       was removed with a cold snare. Resection and retrieval were complete.       Estimated blood loss was minimal.      The exam was otherwise without abnormality. Impression:            - Non-bleeding internal hemorrhoids.                        - Diverticulosis in the sigmoid colon.                        - One 6 mm polyp in the cecum, removed with a cold                         snare. Resected and retrieved.                        - The examination was otherwise normal. Recommendation:        - Patient has a contact number available for                         emergencies. The signs and symptoms  of potential                         delayed complications were discussed with the patient.                         Return to normal activities tomorrow. Written                         discharge instructions were provided to the patient.                        - Resume previous diet.                        - Continue present medications.                        - Repeat colonoscopy is recommended for surveillance.                         The  colonoscopy date will be determined after                         pathology results from today's exam become available                         for review.                        - Return to GI office PRN.                        - The findings and recommendations were discussed with                         the patient. Procedure Code(s):     --- Professional ---                        [phone], Colonoscopy, flexible; with removal of                         tumor(s), polyp(s), or other lesion(s) by snare                         technique Diagnosis Code(s):     --- Professional ---                        K57.30, Diverticulosis of large intestine without                         perforation or abscess without bleeding                        D12.0, Benign neoplasm of cecum                        K64.0, First degree hemorrhoids                        Z86.010, Personal history of colonic polyps CPT copyright  2022 American Medical Association. All rights reserved. The codes documented in this report are preliminary and upon coder review may  be revised to meet current compliance requirements. Ladell MARLA Boss MD, MD 12/13/2023 11:31:19 AM This report has been signed electronically. Number of Addenda: 0 Note Initiated On: 12/13/2023 11:02 AM Scope Withdrawal Time: 0 hours 6 minutes 8 seconds  Total Procedure Duration: 0 hours 11 minutes 56 seconds  Estimated Blood Loss:  Estimated blood loss was minimal.      [facility]

## 2023-12-13 NOTE — Anesthesia Postprocedure Evaluation (Signed)
 Anesthesia Post Note  Patient: Tamara Maxwell  Procedure(s) Performed: COLONOSCOPY POLYPECTOMY, INTESTINE  Patient location during evaluation: Endoscopy Anesthesia Type: General Level of consciousness: awake and alert Pain management: pain level controlled Vital Signs Assessment: post-procedure vital signs reviewed and stable Respiratory status: spontaneous breathing, nonlabored ventilation, respiratory function stable and patient connected to nasal cannula oxygen Cardiovascular status: blood pressure returned to baseline and stable Postop Assessment: no apparent nausea or vomiting Anesthetic complications: no   No notable events documented.   Last Vitals:  Vitals:   12/13/23 1149 12/13/23 1200  BP:  130/77  Pulse: 82 81  Resp: 17 (!) 21  Temp:    SpO2: 97% 97%    Last Pain:  Vitals:   12/13/23 1200  TempSrc:   PainSc: 0-No pain                 Debby Mines

## 2023-12-14 LAB — SURGICAL PATHOLOGY

## 2024-05-23 ENCOUNTER — Other Ambulatory Visit: Payer: Self-pay | Admitting: Obstetrics and Gynecology

## 2024-05-23 DIAGNOSIS — Z1231 Encounter for screening mammogram for malignant neoplasm of breast: Secondary | ICD-10-CM

## 2024-05-30 ENCOUNTER — Other Ambulatory Visit: Payer: Self-pay

## 2024-05-30 ENCOUNTER — Emergency Department
Admission: EM | Admit: 2024-05-30 | Discharge: 2024-05-30 | Disposition: A | Discharge status: Home / Self Care | Attending: Emergency Medicine | Admitting: Emergency Medicine

## 2024-05-30 ENCOUNTER — Emergency Department

## 2024-05-30 DIAGNOSIS — R791 Abnormal coagulation profile: Secondary | ICD-10-CM | POA: Insufficient documentation

## 2024-05-30 DIAGNOSIS — I10 Essential (primary) hypertension: Secondary | ICD-10-CM | POA: Diagnosis not present

## 2024-05-30 DIAGNOSIS — M7989 Other specified soft tissue disorders: Secondary | ICD-10-CM | POA: Insufficient documentation

## 2024-05-30 DIAGNOSIS — J45901 Unspecified asthma with (acute) exacerbation: Secondary | ICD-10-CM | POA: Insufficient documentation

## 2024-05-30 DIAGNOSIS — J111 Influenza due to unidentified influenza virus with other respiratory manifestations: Secondary | ICD-10-CM | POA: Diagnosis not present

## 2024-05-30 DIAGNOSIS — R0602 Shortness of breath: Secondary | ICD-10-CM | POA: Diagnosis present

## 2024-05-30 LAB — BASIC METABOLIC PANEL WITH GFR
Anion gap: 11 (ref 5–15)
BUN: 11 mg/dL (ref 8–23)
CO2: 24 mmol/L (ref 22–32)
Calcium: 10 mg/dL (ref 8.9–10.3)
Chloride: 97 mmol/L — ABNORMAL LOW (ref 98–111)
Creatinine, Ser: 1.08 mg/dL — ABNORMAL HIGH (ref 0.44–1.00)
GFR, Estimated: 58 mL/min — ABNORMAL LOW
Glucose, Bld: 116 mg/dL — ABNORMAL HIGH (ref 70–99)
Potassium: 3.8 mmol/L (ref 3.5–5.1)
Sodium: 132 mmol/L — ABNORMAL LOW (ref 135–145)

## 2024-05-30 LAB — CBC
HCT: 40.3 % (ref 36.0–46.0)
Hemoglobin: 13.4 g/dL (ref 12.0–15.0)
MCH: 30.7 pg (ref 26.0–34.0)
MCHC: 33.3 g/dL (ref 30.0–36.0)
MCV: 92.4 fL (ref 80.0–100.0)
Platelets: 201 K/uL (ref 150–400)
RBC: 4.36 MIL/uL (ref 3.87–5.11)
RDW: 13.3 % (ref 11.5–15.5)
WBC: 8.8 K/uL (ref 4.0–10.5)
nRBC: 0 % (ref 0.0–0.2)

## 2024-05-30 LAB — D-DIMER, QUANTITATIVE: D-Dimer, Quant: 0.54 ug{FEU}/mL — ABNORMAL HIGH (ref 0.00–0.50)

## 2024-05-30 LAB — TROPONIN T, HIGH SENSITIVITY: Troponin T High Sensitivity: <15 ng/L (ref 0–19)

## 2024-05-30 LAB — PRO BRAIN NATRIURETIC PEPTIDE: Pro Brain Natriuretic Peptide: <50 pg/mL

## 2024-05-30 MED ORDER — OSELTAMIVIR PHOSPHATE 75 MG PO CAPS: 75.0000 mg | ORAL_CAPSULE | Freq: Two times a day (BID) | ORAL | 0 refills | Status: AC

## 2024-05-30 MED ORDER — PREDNISONE 20 MG PO TABS: 20.0000 mg | ORAL_TABLET | Freq: Every day | ORAL | 0 refills | Status: DC

## 2024-05-30 MED ORDER — LIDOCAINE 5 % EX PTCH
1.0000 | MEDICATED_PATCH | CUTANEOUS | Status: DC
  Administered 2024-05-30: 1 via TRANSDERMAL
  Filled 2024-05-30: qty 1

## 2024-05-30 MED ORDER — IPRATROPIUM-ALBUTEROL 0.5-2.5 (3) MG/3ML IN SOLN
6.0000 mL | Freq: Once | RESPIRATORY_TRACT | Status: AC
  Administered 2024-05-30: 6 mL via RESPIRATORY_TRACT
  Filled 2024-05-30: qty 3

## 2024-05-30 MED ORDER — ACETAMINOPHEN 500 MG PO TABS
1000.0000 mg | ORAL_TABLET | Freq: Once | ORAL | Status: AC
  Administered 2024-05-30: 1000 mg via ORAL
  Filled 2024-05-30: qty 2

## 2024-05-30 NOTE — ED Provider Notes (Signed)
 "  [facility] Provider Note    Event Date/Time   First MD Initiated Contact with Patient 05/30/24 1012     (approximate)   History   Influenza   HPI  Tamara Maxwell is a 64 y.o. female history of hypertension, asthma coming with shortness of breath and cough.  She noticed some congestion on Saturday.  Progressed to cough, shortness of breath.  States that she only has chest wall pain and pain when she coughs.  She denies any recent travel or surgeries, no history of DVTs or malignancy.  No hormone use.  States that her left lower extremity is slightly larger than the right and that is new.  States that she went to urgent care, was given a DuoNeb with some mild improvement to her shortness of breath.  States she is not quite back to baseline.  States that she has been admitted in the past for asthma.  On independent chart review, she was seen by primary care today and sent to the emergency department for further evaluation.  She had reported congestion and cough since Sunday.  Started to notice some rhinorrhea and shortness of breath as well as left-sided chest comfort only with the cough.  Also noted to have slight labored breathing.  Was speaking full sentences.  Has history of asthma.  They gave her Solu-Medrol as well as DuoNebs.  Was found to be flu positive.     Physical Exam   Triage Vital Signs: ED Triage Vitals [05/30/24 0928]  Encounter Vitals Group     BP 132/65     Girls Systolic BP Percentile      Girls Diastolic BP Percentile      Boys Systolic BP Percentile      Boys Diastolic BP Percentile      Pulse Rate 100     Resp 20     Temp 98.7 F (37.1 C)     Temp src      SpO2 96 %     Weight 222 lb 10.6 oz (101 kg)     Height 5' 6 (1.676 m)     Head Circumference      Peak Flow      Pain Score 0     Pain Loc      Pain Education      Exclude from Growth Chart     Most recent vital signs: Vitals:   05/30/24 0928  BP: 132/65   Pulse: 100  Resp: 20  Temp: 98.7 F (37.1 C)  SpO2: 96%     General: Awake, no distress.  CV:  Good peripheral perfusion.  Resp:  Normal effort.  There are some sparse wheezing with a cough, otherwise lungs are clear, no tachypnea or increased work of breathing Abd:  No distention.  Soft nontender Other:  Trace lower extremity edema bilaterally, her left lower extremity slightly larger than the right.   ED Results / Procedures / Treatments   Labs (all labs ordered are listed, but only abnormal results are displayed) Labs Reviewed  BASIC METABOLIC PANEL WITH GFR - Abnormal; Notable for the following components:      Result Value   Sodium 132 (*)    Chloride 97 (*)    Glucose, Bld 116 (*)    Creatinine, Ser 1.08 (*)    GFR, Estimated 58 (*)    All other components within normal limits  D-DIMER, QUANTITATIVE (NOT AT [facility]) - Abnormal; Notable for the following components:  D-Dimer, Quant 0.54 (*)    All other components within normal limits  CBC  PRO BRAIN NATRIURETIC PEPTIDE  TROPONIN T, HIGH SENSITIVITY     EKG  EKG shows, sinus tachycardia, rate 101, normal QS, normal QTc, T wave flattening in aVL, no prior to compare, no obvious ischemic ST elevation.   RADIOLOGY On my independent interpretation, chest x-ray without obvious consolidation   PROCEDURES:  Critical Care performed: No  Procedures   MEDICATIONS ORDERED IN ED: Medications  lidocaine  (LIDODERM ) 5 % 1 patch (1 patch Transdermal Patch Applied 05/30/24 1055)  ipratropium-albuterol  (DUONEB) 0.5-2.5 (3) MG/3ML nebulizer solution 6 mL (6 mLs Nebulization Given 05/30/24 1057)  acetaminophen  (TYLENOL ) tablet 1,000 mg (1,000 mg Oral Given 05/30/24 1056)     IMPRESSION / MDM / ASSESSMENT AND PLAN / ED COURSE  I reviewed the triage vital signs and the nursing notes.                              Differential diagnosis includes, but is not limited to, influenza, asthma exacerbation, pneumonia, arrhythmia,  atypical ACS, DVT, did consider PE but patient has no other risk factors for it.  Get D-dimer, labs, EKG, troponin.  Will give her some additional DuoNebs here.  Tylenol  and Lidoderm  for chest wall pain.  Unable to see the chest x-ray that was ordered in clinic, will add on chest x-ray here.  Patient's presentation is most consistent with acute presentation with potential threat to life or bodily function.  Independent interpretation of labs and imaging below.  Clinical course as below.  On reassessment patient is feeling significantly better, shortness of breath is improved.  No increased work of breathing or tachypnea on exam.  Discussed with her about imaging and lab results, will give her 4 more days of steroids, Tamiflu .  States that she has albuterol  inhaler, does not need a refill.  Instructed her to follow-up with primary care next week for reassessment.  Considered but no indication for inpatient admission at this time, she safe for outpatient management.  Will discharge with strict return precautions.    Clinical Course as of 05/30/24 1257  Thu May 30, 2024  1021 Independent review of labs, no leukocytosis, electrolytes not severely deranged, creatinine is not significantly elevated.  Troponin and BNP are not elevated. [TT]  1213 D-Dimer, Quant(!): 0.54 Mildly elevated but age-adjusted negative. [TT]  1213 DG Chest 1 View No active disease.  [TT]  1253 US  Venous Img Lower  Left (DVT Study) 1. No evidence of deep venous thrombosis.  [TT]    Clinical Course User Index [TT] Waymond Lorelle Cummins, MD     FINAL CLINICAL IMPRESSION(S) / ED DIAGNOSES   Final diagnoses:  Influenza  Exacerbation of asthma, unspecified asthma severity, unspecified whether persistent  Leg swelling     Rx / DC Orders   ED Discharge Orders          Ordered    predniSONE  (DELTASONE ) 20 MG tablet  Daily with breakfast        05/30/24 1256    oseltamivir  (TAMIFLU ) 75 MG capsule  2 times daily         05/30/24 1256             Note:  This document was prepared using Dragon voice recognition software and may include unintentional dictation errors.    Waymond Lorelle Cummins, MD 05/30/24 1257  "

## 2024-05-30 NOTE — Progress Notes (Signed)
 "  06/05/24 9:07 AM   Tamara Maxwell [birth year] [id number]   HPI: 64 y.o. female here for initial evaluation of dysuria No information provided in referral Negative UA from Dec 2025, July 2025  First time meeting today 1-2-year history of OAB symptoms, daytime urgency, small-volume voids, 3x nocturia Tried 1 year 5-10 mg Vesicare -ineffective Rare UUI, no SUI Drinks 1 daily coffee +3 Mountain Dew's Denies constipation Negative UA today  History of prior total hysterectomy  - Remote history of prior oral estrogen, no longer taking Some vaginal dryness/burning  Lifelong smoker    PMH: Past Medical History:  Diagnosis Date   Hypertension     Surgical History: Past Surgical History:  Procedure Laterality Date   ABDOMINAL HYSTERECTOMY     COLONOSCOPY N/A 12/13/2023   Procedure: COLONOSCOPY;  Surgeon: Toledo, Ladell POUR, MD;  Location: [facility] ENDOSCOPY;  Service: Gastroenterology;  Laterality: N/A;  DM   POLYPECTOMY  12/13/2023   Procedure: POLYPECTOMY, INTESTINE;  Surgeon: Toledo, Ladell POUR, MD;  Location: [facility] ENDOSCOPY;  Service: Gastroenterology;;    Family History: Family History  Problem Relation Age of Onset   Breast cancer Neg Hx     Social History:  reports that she has been smoking cigarettes. She started smoking about 12 months ago. She has a 1.1 pack-year smoking history. She does not have any smokeless tobacco history on file. She reports that she does not currently use alcohol. She reports that she does not use drugs.      Physical Exam: BP (!) 144/76   Pulse (!) 108   Ht 5' 6 (1.676 m)   Wt 199 lb 9.6 oz (90.5 kg)   BMI 32.22 kg/m    Constitutional:  Alert and oriented, No acute distress. Cardiovascular: No clubbing, cyanosis, or edema. Respiratory: Normal respiratory effort, no increased work of breathing. GI: Nondistended Skin: No rashes, bruises or suspicious lesions. Neurologic: Grossly intact, no focal deficits, moving all 4  extremities. Psychiatric: Normal mood and affect.  Laboratory Data: Component Ref Range & Units 2 wk ago  Color Colorless, Straw, Light Yellow, Yellow, Dark Yellow Colorless  Clarity Clear Clear  Specific Gravity 1.005 - 1.030 1.006  pH, Urine 5.0 - 8.0 6.0  Protein, Urinalysis Negative mg/dL Negative  Glucose, Urinalysis Negative mg/dL Negative  Ketones, Urinalysis Negative mg/dL Negative  Blood, Urinalysis Negative Negative  Nitrite, Urinalysis Negative Negative  Leukocyte Esterase, Urinalysis Negative Negative  Bilirubin, Urinalysis Negative Negative  Urobilinogen, Urinalysis 0.2 - 1.0 mg/dL 0.2  WBC, UA <=5 /hpf 0  Red Blood Cells, Urinalysis <=3 /hpf 0  Bacteria, Urinalysis 0 - 5 /hpf 0-5  Squamous Epithelial Cells, Urinalysis /hpf 0     Pertinent Imaging: N/A    Assessment & Plan:    Dysuria -     Urinalysis, Complete  OAB (overactive bladder) Assessment & Plan: ~1-2 yr hx of OAB, mild UUI  - failed prior Vesicare  Today, we reviewed the diagnosis of female overactive bladder (OAB) which is a very common issue with multifactorial etiologies. This is typically a benign condition, but can cause significant distress and reduced quality of life. Initial management emphasizes conservative therapies: bladder training, timed voiding, fluid/caffeine modification, pelvic floor muscle therapy, and behavioral strategies, with pharmacologic or procedural options reserved for refractory cases.   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, [address] [place], [place] [address] [phone] "

## 2024-05-30 NOTE — ED Triage Notes (Signed)
 Pt to ED for shob for a few days, tested positive for flu today at [facility]. Given solumedrol and duoneb PTA. Pt speaking in complete sentences. RR even and unlabored, NAD noted Reports had chest xray at [facility] today

## 2024-05-30 NOTE — Discharge Instructions (Signed)
 Please take the Tamiflu  and prednisone  as prescribed.  You received a dose of steroids today, please start your steroids tomorrow.  Please be sure to follow-up with your primary care doctor next week for reassessment.

## 2024-06-05 ENCOUNTER — Encounter: Payer: Self-pay | Admitting: Urology

## 2024-06-05 ENCOUNTER — Ambulatory Visit (INDEPENDENT_AMBULATORY_CARE_PROVIDER_SITE_OTHER): Admitting: Urology

## 2024-06-05 VITALS — BP 147/74 | HR 104 | Ht 66.0 in | Wt 199.6 lb

## 2024-06-05 DIAGNOSIS — R300 Dysuria: Secondary | ICD-10-CM

## 2024-06-05 DIAGNOSIS — N3281 Overactive bladder: Secondary | ICD-10-CM | POA: Diagnosis not present

## 2024-06-05 LAB — URINALYSIS, COMPLETE
Bilirubin, UA: NEGATIVE
Glucose, UA: NEGATIVE
Ketones, UA: NEGATIVE
Leukocytes,UA: NEGATIVE
Nitrite, UA: NEGATIVE
Protein,UA: NEGATIVE
RBC, UA: NEGATIVE
Specific Gravity, UA: 1.01 (ref 1.005–1.030)
Urobilinogen, Ur: 0.2 mg/dL (ref 0.2–1.0)
pH, UA: 6.5 (ref 5.0–7.5)

## 2024-06-05 LAB — MICROSCOPIC EXAMINATION: Bacteria, UA: NONE SEEN

## 2024-06-05 NOTE — Progress Notes (Signed)
 Tamara Maxwell presents for an office/procedure visit. BP today is 147/74. She is complaint with BP medication. Greater than 140/90. Provider  notified. Pt advised to talk to her cardiologist about her constant elevated blood pressure. Pt voiced understanding.

## 2024-06-05 NOTE — Assessment & Plan Note (Addendum)
~  1-2 yr hx of OAB, mild UUI  - failed prior Vesicare  Today, we reviewed the diagnosis of female overactive bladder (OAB) which is a very common issue with multifactorial etiologies. This is typically a benign condition, but can cause significant distress and reduced quality of life. Initial management emphasizes conservative therapies: bladder training, timed voiding, fluid/caffeine modification, pelvic floor muscle therapy, and behavioral strategies, with pharmacologic or procedural options reserved for refractory cases.   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

## 2024-06-12 ENCOUNTER — Other Ambulatory Visit: Payer: Self-pay

## 2024-06-12 MED ORDER — GEMTESA 75 MG PO TABS: 75.0000 mg | ORAL_TABLET | Freq: Every day | ORAL | 2 refills | Status: DC

## 2024-06-19 ENCOUNTER — Other Ambulatory Visit: Payer: Self-pay

## 2024-06-19 DIAGNOSIS — N3281 Overactive bladder: Secondary | ICD-10-CM

## 2024-06-19 MED ORDER — GEMTESA 75 MG PO TABS: 75.0000 mg | ORAL_TABLET | Freq: Every day | ORAL | 2 refills | Status: AC

## 2024-06-24 ENCOUNTER — Encounter

## 2024-07-05 ENCOUNTER — Ambulatory Visit: Admitting: Physician Assistant

## 2024-07-19 ENCOUNTER — Ambulatory Visit: Admitting: Physician Assistant
